# Patient Record
Sex: Male | Born: 1970 | Race: White | Hispanic: No | Marital: Married | Smoking: Never smoker
Health system: Southern US, Community
[De-identification: ages and names within clinical notes are randomized; demographics above are authoritative.]

## PROBLEM LIST (undated history)

## (undated) DIAGNOSIS — J45909 Unspecified asthma, uncomplicated: Secondary | ICD-10-CM

## (undated) DIAGNOSIS — B349 Viral infection, unspecified: Secondary | ICD-10-CM

## (undated) DIAGNOSIS — R002 Palpitations: Secondary | ICD-10-CM

## (undated) DIAGNOSIS — R000 Tachycardia, unspecified: Secondary | ICD-10-CM

## (undated) HISTORY — DX: Tachycardia, unspecified: R00.0

## (undated) HISTORY — DX: Unspecified asthma, uncomplicated: J45.909

## (undated) HISTORY — DX: Palpitations: R00.2

## (undated) HISTORY — DX: Viral infection, unspecified: B34.9

---

## 2018-12-01 ENCOUNTER — Other Ambulatory Visit: Payer: Self-pay

## 2018-12-01 ENCOUNTER — Other Ambulatory Visit: Payer: Self-pay | Admitting: Family Medicine

## 2018-12-01 ENCOUNTER — Ambulatory Visit
Admission: RE | Admit: 2018-12-01 | Discharge: 2018-12-01 | Disposition: A | Discharge status: Home / Self Care | Payer: Self-pay | Source: Ambulatory Visit | Attending: Internal Medicine | Admitting: Internal Medicine

## 2018-12-01 DIAGNOSIS — I10 Essential (primary) hypertension: Secondary | ICD-10-CM | POA: Diagnosis not present

## 2018-12-01 DIAGNOSIS — E039 Hypothyroidism, unspecified: Secondary | ICD-10-CM | POA: Diagnosis not present

## 2018-12-01 DIAGNOSIS — R079 Chest pain, unspecified: Secondary | ICD-10-CM

## 2018-12-01 DIAGNOSIS — E7800 Pure hypercholesterolemia, unspecified: Secondary | ICD-10-CM | POA: Diagnosis not present

## 2018-12-01 DIAGNOSIS — R05 Cough: Secondary | ICD-10-CM | POA: Diagnosis not present

## 2018-12-01 DIAGNOSIS — Z5181 Encounter for therapeutic drug level monitoring: Secondary | ICD-10-CM | POA: Diagnosis not present

## 2018-12-01 DIAGNOSIS — R0789 Other chest pain: Secondary | ICD-10-CM | POA: Diagnosis not present

## 2019-03-28 DIAGNOSIS — J4541 Moderate persistent asthma with (acute) exacerbation: Secondary | ICD-10-CM | POA: Diagnosis not present

## 2019-03-29 DIAGNOSIS — R062 Wheezing: Secondary | ICD-10-CM | POA: Diagnosis not present

## 2020-05-29 ENCOUNTER — Telehealth: Payer: Self-pay | Admitting: Nurse Practitioner

## 2020-05-29 NOTE — Telephone Encounter (Signed)
Pt called to schedule longhauler appt @ [facility]. Pt states mild symptoms began 03/2020. Treated for bronchitis with antibiotic and prednisone. Pt states hx asthma since childhood.  Pt concerned of potential for scarred lungs from [facility] may cause severity if not found & he "get covid".   Pt self refer. Pt states no other specialists only PCP. appt scheduled [facility] next week.

## 2020-06-07 ENCOUNTER — Ambulatory Visit: Payer: BLUE CROSS/BLUE SHIELD

## 2020-06-08 ENCOUNTER — Ambulatory Visit
Admission: RE | Admit: 2020-06-08 | Discharge: 2020-06-08 | Disposition: A | Discharge status: Home / Self Care | Payer: 59 | Source: Ambulatory Visit | Attending: Nurse Practitioner | Admitting: Nurse Practitioner

## 2020-06-08 ENCOUNTER — Ambulatory Visit (INDEPENDENT_AMBULATORY_CARE_PROVIDER_SITE_OTHER): Payer: 59 | Admitting: Nurse Practitioner

## 2020-06-08 VITALS — BP 117/102 | HR 59 | Temp 97.3°F | Resp 18

## 2020-06-08 DIAGNOSIS — Z8619 Personal history of other infectious and parasitic diseases: Secondary | ICD-10-CM | POA: Diagnosis not present

## 2020-06-08 DIAGNOSIS — R002 Palpitations: Secondary | ICD-10-CM

## 2020-06-08 NOTE — Progress Notes (Signed)
@Patient  ID: , male    DOB: 06-19-1970, 50 y.o.   MRN: [id number]  Chief Complaint  Patient presents with  . viral illness    Never tested positive for Covid, Patient was sick with viral illness in December 2021.     Referring provider: January 2022, *    HPI  Patient presents today for post COVID care clinic visit.  Patient states that he was sick in December 2021.  Patient never tested positive but did have family members were positive for Covid.  Patient states that he is doing much better at this point.  He does have a history of asthma and is concerned about his lungs.  He is requesting a chest x-ray to make sure everything is okay and there is no lung damage.  Patient states that since being diagnosed with Covid he has been having periods of tachycardia and palpitations.  He has been trying to stay active, well-hydrated, and trying to eat well. Denies f/c/s, n/v/d, hemoptysis, PND, chest pain or edema.     Not on File   There is no immunization history on file for this patient.  No past medical history on file.  Tobacco History: Social History   Tobacco Use  Smoking Status Not on file  Smokeless Tobacco Not on file   Counseling given: Yes   Outpatient Encounter Medications as of 06/08/2020  Medication Sig  . budesonide-formoterol (SYMBICORT) 160-4.5 MCG/ACT inhaler Inhale into the lungs.  08/06/2020 escitalopram (LEXAPRO) 10 MG tablet Take 10 mg by mouth daily.  Marland Kitchen levothyroxine (SYNTHROID) 100 MCG tablet Take 100 mcg by mouth every morning.  Marland Kitchen lisinopril (ZESTRIL) 20 MG tablet Take 20 mg by mouth daily.  . simvastatin (ZOCOR) 40 MG tablet SMARTSIG:1 Tablet(s) By Mouth Every Evening   No facility-administered encounter medications on file as of 06/08/2020.     Review of Systems  Review of Systems  Constitutional: Negative.  Negative for fatigue and fever.  HENT: Negative.   Respiratory: Negative for cough and shortness of breath.    Cardiovascular: Positive for palpitations. Negative for chest pain and leg swelling.  Gastrointestinal: Negative.   Allergic/Immunologic: Negative.   Neurological: Negative.   Psychiatric/Behavioral: Negative.        Physical Exam  BP (!) 117/102   Pulse (!) 59   Temp (!) 97.3 F (36.3 C)   Resp 18   SpO2 100%   Wt Readings from Last 5 Encounters:  No data found for Wt     Physical Exam Vitals and nursing note reviewed.  Constitutional:      General: He is not in acute distress.    Appearance: He is well-developed and well-nourished.  Cardiovascular:     Rate and Rhythm: Normal rate and regular rhythm.  Pulmonary:     Effort: Pulmonary effort is normal.     Breath sounds: Normal breath sounds.  Musculoskeletal:     Right lower leg: No edema.     Left lower leg: No edema.  Skin:    General: Skin is warm and dry.  Neurological:     Mental Status: He is alert and oriented to person, place, and time.  Psychiatric:        Mood and Affect: Mood and affect and mood normal.        Behavior: Behavior normal.       Imaging: DG Chest 2 View  Result Date: 06/08/2020 CLINICAL DATA:  Follow-up prior COVID infection EXAM: CHEST - 2 VIEW COMPARISON:  12/01/2018 FINDINGS: The heart size and mediastinal contours are within normal limits. Both lungs are clear. The visualized skeletal structures are unremarkable. IMPRESSION: No active cardiopulmonary disease. Electronically Signed   By: Alcide Clever M.D.   On: 06/08/2020 14:38     Assessment & Plan:   History of viral illness Stay well hydrated  Stay active  Deep breathing exercises  May take tylenol or fever or pain  Will order chest x ray:  [facility] [address] [place], [place] [address] [phone] MON - FRI 8:00 AM - 4:00 PM - WALK IN   Tachycardia Palpitations:  Will place referral to cardiology   Follow up:  Follow up if needed      Ivonne Andrew, NP 06/11/2020

## 2020-06-08 NOTE — Patient Instructions (Addendum)
History of viral illness:   Stay well hydrated  Stay active  Deep breathing exercises  May take tylenol or fever or pain  Will order chest x ray:  [facility] [address] [place], [place] [address] [phone] MON - FRI 8:00 AM - 4:00 PM - WALK IN   Tachycardia Palpitations:  Will place referral to cardiology   Follow up:  Follow up if needed

## 2020-06-11 DIAGNOSIS — Z8619 Personal history of other infectious and parasitic diseases: Secondary | ICD-10-CM | POA: Insufficient documentation

## 2020-06-11 DIAGNOSIS — R002 Palpitations: Secondary | ICD-10-CM | POA: Insufficient documentation

## 2020-06-11 NOTE — Assessment & Plan Note (Signed)
Stay well hydrated  Stay active  Deep breathing exercises  May take tylenol or fever or pain  Will order chest x ray:  [facility] [address] [place], [place] [address] [phone] MON - FRI 8:00 AM - 4:00 PM - WALK IN   Tachycardia Palpitations:  Will place referral to cardiology   Follow up:  Follow up if needed

## 2020-06-18 NOTE — Progress Notes (Signed)
Cardiology Office Note:    Date:  06/22/2020   ID:  Brent Morrow, DOB 06/12/1970, MRN [id number]  PCP:  Brent Hale, MD   [facility]  Cardiologist:  No primary care provider on file.  Advanced Practice Provider:  No care team member to display Electrophysiologist:  None    Referring MD: Brent Andrew, NP    History of Present Illness:    Brent Morrow is a 50 y.o. male with a hx of COVID infection and asthmas who was referred by Brent Mallory, NP for further evaluation of palpitations.  Patient states that since being diagnosed with COVID (never tested positive, but multiple family members were known positive in 03/2020), he has been having periods of palpitations and tachycardia. No chest pain, lightheadedness, dizziness or syncope. Has tried to remain active and hydrated.   The patient states that he has noticed his heart rate has increased since COVID. Has not measured the pulse but feels like it is pounding harder. This was worse initially after COVID and has slowly improved. Occasional sensation of heart skipping beats or fluttering but this is unchanged over several years. Patient is active and able to fast walking on the treadmill and then heavy weights without issues. No lightheadedness, dizziness, fainting, LE edema, orthopnea. Resting HR in office is 51bpm.  Had stress test in [place] at age 46 which was normal. TTE with normal EF, moderate PI, mild MR.   Family history: No known history of heart disease.  Past Medical History:  Diagnosis Date   Asthma    Palpitations    Tachycardia    Viral illness     No past surgical history on file.  Current Medications: Current Meds  Medication Sig   albuterol (PROVENTIL) (2.5 MG/3ML) 0.083% nebulizer solution as needed.   budesonide-formoterol (SYMBICORT) 160-4.5 MCG/ACT inhaler Inhale into the lungs.   calcium carbonate (OSCAL) 1500 (600 Ca) MG TABS tablet once a  week.   Cholecalciferol (VITAMIN D3) 50 MCG (2000 UT) capsule 2 (two) times a week.   escitalopram (LEXAPRO) 10 MG tablet Take 10 mg by mouth daily.   Fluticasone-Salmeterol (ADVAIR) 100-50 MCG/DOSE AEPB daily.   levothyroxine (SYNTHROID) 100 MCG tablet Take 100 mcg by mouth every morning.   lisinopril (ZESTRIL) 20 MG tablet Take 20 mg by mouth daily.   Multiple Vitamins-Minerals (MULTI FOR HIM) TABS once a week.   simvastatin (ZOCOR) 40 MG tablet SMARTSIG:1 Tablet(s) By Mouth Every Evening     Allergies:   Patient has no known allergies.   Social History   Socioeconomic History   Marital status: Married    Spouse name: Not on file   Number of children: Not on file   Years of education: Not on file   Highest education level: Not on file  Occupational History   Not on file  Tobacco Use   Smoking status: Never Smoker   Smokeless tobacco: Never Used  Substance and Sexual Activity   Alcohol use: Yes    Comment: occassional   Drug use: Never   Sexual activity: Yes  Other Topics Concern   Not on file  Social History Narrative   Not on file   Social Determinants of Health   Financial Resource Strain: Not on file  Food Insecurity: Not on file  Transportation Needs: Not on file  Physical Activity: Not on file  Stress: Not on file  Social Connections: Not on file     Family History: The patient's family  history is not on file.  ROS:   Please see the history of present illness.    Review of Systems  Constitutional: Negative for chills, fever and malaise/fatigue.  Eyes: Negative for blurred vision and double vision.  Respiratory: Negative for shortness of breath, wheezing and stridor.   Cardiovascular: Positive for palpitations. Negative for chest pain, orthopnea, claudication, leg swelling and PND.  Gastrointestinal: Negative for melena, nausea and vomiting.  Genitourinary: Negative for flank pain and frequency.  Musculoskeletal: Negative for falls.   Neurological: Negative for dizziness and loss of consciousness.  Endo/Heme/Allergies: Negative for polydipsia.  Psychiatric/Behavioral: Negative for substance abuse.    EKGs/Labs/Other Studies Reviewed:    The following studies were reviewed today: Stress test 2015: -No evidence of ischemia -TTE with normal EF, moderate PR, mild MR  EKG:  EKG is  ordered today.  The ekg ordered today demonstrates sinus bradycardia  Recent Labs: No results found for requested labs within last 8760 hours.  Recent Lipid Panel No results found for: CHOL, TRIG, HDL, CHOLHDL, VLDL, LDLCALC, LDLDIRECT   Risk Assessment/Calculations:       Physical Exam:    VS:  BP 112/68    Pulse (!) 51    Ht 5\' 10"  (1.778 m)    Wt 192 lb (87.1 kg)    SpO2 97%    BMI 27.55 kg/m     Wt Readings from Last 3 Encounters:  06/22/20 192 lb (87.1 kg)     GEN:  Well nourished, well developed in no acute distress HEENT: Normal NECK: No JVD; No carotid bruits CARDIAC: Bradycardic, regular, no murmurs, rubs, gallops RESPIRATORY:  Clear to auscultation without rales, wheezing or rhonchi  ABDOMEN: Soft, non-tender, non-distended MUSCULOSKELETAL:  No edema; No deformity  SKIN: Warm and dry NEUROLOGIC:  Alert and oriented x 3 PSYCHIATRIC:  Normal affect   ASSESSMENT:    1. Palpitations   2. History of viral illness   3. Primary hypertension   4. Mixed hyperlipidemia    PLAN:    In order of problems listed above:  #Elevated HR: Improved. Noticed that his resting HR increased after COVID infection. No palpitations, but felt like his heart was pounding. Symptoms have overall improved. He is currently active and exercising 1.5 hours every day without issues. Resting HR in the office 51bpm. ECG without ischemia or block. No further work-up needed at this time. -No further work-up needed at this time as symptoms improving and ECG without concerning changes  #HTN: Well controlled.  -Continue lisinopril 20mg   daily  #HLD: Well controlled with LDL 86. -Continue simvastatin 40mg  daily -Risk stratification with coronary calcium score -Continue healthy diet and exercise as detailed below  #Moderate PI/Mild MR: Noted on TTE performed in [place] in 2015. -Serial monitoring with echo  Exercise recommendations: Goal of exercising for at least 30 minutes a day, at least 5 times per week.  Please exercise to a moderate exertion.  This means that while exercising it is difficult to speak in full sentences, however you are not so short of breath that you feel you must stop, and not so comfortable that you can carry on a full conversation.  Exertion level should be approximately a 5/10, if 10 is the most exertion you can perform.  Diet recommendations: Recommend a heart healthy diet such as the Mediterranean diet.  This diet consists of plant based foods, healthy fats, lean meats, olive oil.  It suggests limiting the intake of simple carbohydrates such as white breads, pastries, and  pastas.  It also limits the amount of red meat, wine, and dairy products such as cheese that one should consume on a daily basis.    Medication Adjustments/Labs and Tests Ordered: Current medicines are reviewed at length with the patient today.  Concerns regarding medicines are outlined above.  Orders Placed This Encounter  Procedures   CT CARDIAC SCORING (SELF PAY ONLY)   EKG 12-Lead   No orders of the defined types were placed in this encounter.   Patient Instructions  Medication Instructions:  Your physician recommends that you continue on your current medications as directed. Please refer to the Current Medication list given to you today.  *If you need a refill on your cardiac medications before your next appointment, please call your pharmacy*   Lab Work: None ordered  If you have labs (blood work) drawn today and your tests are completely normal, you will receive your results only by:  MyChart Message (if  you have MyChart) OR  A paper copy in the mail If you have any lab test that is abnormal or we need to change your treatment, we will call you to review the results.   Testing/Procedures: Your physician recommends that you have a Coronary CT Calcium Score.  This is an out of pocket expense of $99.     Follow-Up: At [facility], you and your health needs are our priority.  As part of our continuing mission to provide you with exceptional heart care, we have created designated Provider Care Teams.  These Care Teams include your primary Cardiologist (physician) and Advanced Practice Providers (APPs -  Physician Assistants and Nurse Practitioners) who all work together to provide you with the care you need, when you need it.  We recommend signing up for the patient portal called "MyChart".  Sign up information is provided on this After Visit Summary.  MyChart is used to connect with patients for Virtual Visits (Telemedicine).  Patients are able to view lab/test results, encounter notes, upcoming appointments, etc.  Non-urgent messages can be sent to your provider as well.   To learn more about what you can do with MyChart, go to [email or web address].    Your next appointment:   As needed   The format for your next appointment:   In Person  Provider:   Laurance Flatten, MD   Other Instructions      Signed, Meriam Sprague, MD  06/22/2020 9:05 AM    [facility]

## 2020-06-22 ENCOUNTER — Other Ambulatory Visit: Payer: Self-pay

## 2020-06-22 ENCOUNTER — Ambulatory Visit: Payer: 59 | Admitting: Cardiology

## 2020-06-22 ENCOUNTER — Encounter: Payer: Self-pay | Admitting: Cardiology

## 2020-06-22 VITALS — BP 112/68 | HR 51 | Ht 70.0 in | Wt 192.0 lb

## 2020-06-22 DIAGNOSIS — R002 Palpitations: Secondary | ICD-10-CM

## 2020-06-22 DIAGNOSIS — I10 Essential (primary) hypertension: Secondary | ICD-10-CM

## 2020-06-22 DIAGNOSIS — E782 Mixed hyperlipidemia: Secondary | ICD-10-CM

## 2020-06-22 DIAGNOSIS — Z8619 Personal history of other infectious and parasitic diseases: Secondary | ICD-10-CM | POA: Diagnosis not present

## 2020-06-22 NOTE — Patient Instructions (Addendum)
Medication Instructions:  Your physician recommends that you continue on your current medications as directed. Please refer to the Current Medication list given to you today.  *If you need a refill on your cardiac medications before your next appointment, please call your pharmacy*   Lab Work: None ordered  If you have labs (blood work) drawn today and your tests are completely normal, you will receive your results only by: Marland Kitchen MyChart Message (if you have MyChart) OR . A paper copy in the mail If you have any lab test that is abnormal or we need to change your treatment, we will call you to review the results.   Testing/Procedures: Your physician recommends that you have a Coronary CT Calcium Score.  This is an out of pocket expense of $99.     Follow-Up: At [facility], you and your health needs are our priority.  As part of our continuing mission to provide you with exceptional heart care, we have created designated Provider Care Teams.  These Care Teams include your primary Cardiologist (physician) and Advanced Practice Providers (APPs -  Physician Assistants and Nurse Practitioners) who all work together to provide you with the care you need, when you need it.  We recommend signing up for the patient portal called "MyChart".  Sign up information is provided on this After Visit Summary.  MyChart is used to connect with patients for Virtual Visits (Telemedicine).  Patients are able to view lab/test results, encounter notes, upcoming appointments, etc.  Non-urgent messages can be sent to your provider as well.   To learn more about what you can do with MyChart, go to [email or web address].    Your next appointment:   As needed   The format for your next appointment:   In Person  Provider:   Laurance Flatten, MD   Other Instructions

## 2020-07-06 ENCOUNTER — Other Ambulatory Visit: Payer: Self-pay

## 2020-07-06 ENCOUNTER — Ambulatory Visit (INDEPENDENT_AMBULATORY_CARE_PROVIDER_SITE_OTHER)
Admission: RE | Admit: 2020-07-06 | Discharge: 2020-07-06 | Disposition: A | Discharge status: Home / Self Care | Payer: Self-pay | Source: Ambulatory Visit | Attending: Cardiology | Admitting: Cardiology

## 2020-07-06 DIAGNOSIS — Z8619 Personal history of other infectious and parasitic diseases: Secondary | ICD-10-CM

## 2021-12-27 DIAGNOSIS — R69 Illness, unspecified: Secondary | ICD-10-CM | POA: Diagnosis not present

## 2021-12-27 DIAGNOSIS — R5383 Other fatigue: Secondary | ICD-10-CM | POA: Diagnosis not present

## 2021-12-27 DIAGNOSIS — E039 Hypothyroidism, unspecified: Secondary | ICD-10-CM | POA: Diagnosis not present

## 2021-12-27 DIAGNOSIS — I10 Essential (primary) hypertension: Secondary | ICD-10-CM | POA: Diagnosis not present

## 2021-12-27 DIAGNOSIS — Z0000 Encounter for general adult medical examination without abnormal findings: Secondary | ICD-10-CM | POA: Diagnosis not present

## 2021-12-27 DIAGNOSIS — E7800 Pure hypercholesterolemia, unspecified: Secondary | ICD-10-CM | POA: Diagnosis not present

## 2021-12-27 DIAGNOSIS — Z125 Encounter for screening for malignant neoplasm of prostate: Secondary | ICD-10-CM | POA: Diagnosis not present

## 2021-12-27 DIAGNOSIS — J4541 Moderate persistent asthma with (acute) exacerbation: Secondary | ICD-10-CM | POA: Diagnosis not present

## 2021-12-27 DIAGNOSIS — Z23 Encounter for immunization: Secondary | ICD-10-CM | POA: Diagnosis not present

## 2021-12-27 DIAGNOSIS — N529 Male erectile dysfunction, unspecified: Secondary | ICD-10-CM | POA: Diagnosis not present

## 2022-03-09 DIAGNOSIS — J45909 Unspecified asthma, uncomplicated: Secondary | ICD-10-CM | POA: Diagnosis not present

## 2022-03-09 DIAGNOSIS — R69 Illness, unspecified: Secondary | ICD-10-CM | POA: Diagnosis not present

## 2022-03-09 DIAGNOSIS — Z7951 Long term (current) use of inhaled steroids: Secondary | ICD-10-CM | POA: Diagnosis not present

## 2022-03-09 DIAGNOSIS — E039 Hypothyroidism, unspecified: Secondary | ICD-10-CM | POA: Diagnosis not present

## 2022-03-09 DIAGNOSIS — I10 Essential (primary) hypertension: Secondary | ICD-10-CM | POA: Diagnosis not present

## 2022-03-09 DIAGNOSIS — E785 Hyperlipidemia, unspecified: Secondary | ICD-10-CM | POA: Diagnosis not present

## 2022-03-09 DIAGNOSIS — Z8249 Family history of ischemic heart disease and other diseases of the circulatory system: Secondary | ICD-10-CM | POA: Diagnosis not present

## 2022-07-16 DIAGNOSIS — J0101 Acute recurrent maxillary sinusitis: Secondary | ICD-10-CM | POA: Diagnosis not present

## 2022-07-16 DIAGNOSIS — J4521 Mild intermittent asthma with (acute) exacerbation: Secondary | ICD-10-CM | POA: Diagnosis not present

## 2022-08-07 DIAGNOSIS — I10 Essential (primary) hypertension: Secondary | ICD-10-CM | POA: Diagnosis not present

## 2022-08-07 DIAGNOSIS — Z87891 Personal history of nicotine dependence: Secondary | ICD-10-CM | POA: Diagnosis not present

## 2022-08-07 DIAGNOSIS — F419 Anxiety disorder, unspecified: Secondary | ICD-10-CM | POA: Diagnosis not present

## 2022-08-07 DIAGNOSIS — Z7951 Long term (current) use of inhaled steroids: Secondary | ICD-10-CM | POA: Diagnosis not present

## 2022-08-07 DIAGNOSIS — E039 Hypothyroidism, unspecified: Secondary | ICD-10-CM | POA: Diagnosis not present

## 2022-08-07 DIAGNOSIS — J45909 Unspecified asthma, uncomplicated: Secondary | ICD-10-CM | POA: Diagnosis not present

## 2022-08-07 DIAGNOSIS — L309 Dermatitis, unspecified: Secondary | ICD-10-CM | POA: Diagnosis not present

## 2022-08-07 DIAGNOSIS — N529 Male erectile dysfunction, unspecified: Secondary | ICD-10-CM | POA: Diagnosis not present

## 2022-08-07 DIAGNOSIS — Z823 Family history of stroke: Secondary | ICD-10-CM | POA: Diagnosis not present

## 2022-08-07 DIAGNOSIS — E785 Hyperlipidemia, unspecified: Secondary | ICD-10-CM | POA: Diagnosis not present

## 2022-08-07 DIAGNOSIS — F325 Major depressive disorder, single episode, in full remission: Secondary | ICD-10-CM | POA: Diagnosis not present

## 2022-08-07 DIAGNOSIS — Z8249 Family history of ischemic heart disease and other diseases of the circulatory system: Secondary | ICD-10-CM | POA: Diagnosis not present

## 2022-08-15 IMAGING — CR DG CHEST 2V
2 series · 2 of 2 positions shown · non-contrast
Comparison: 12/01/2018

CLINICAL DATA: Follow-up prior COVID infection

EXAM:
CHEST - 2 VIEW

[w chest pa]
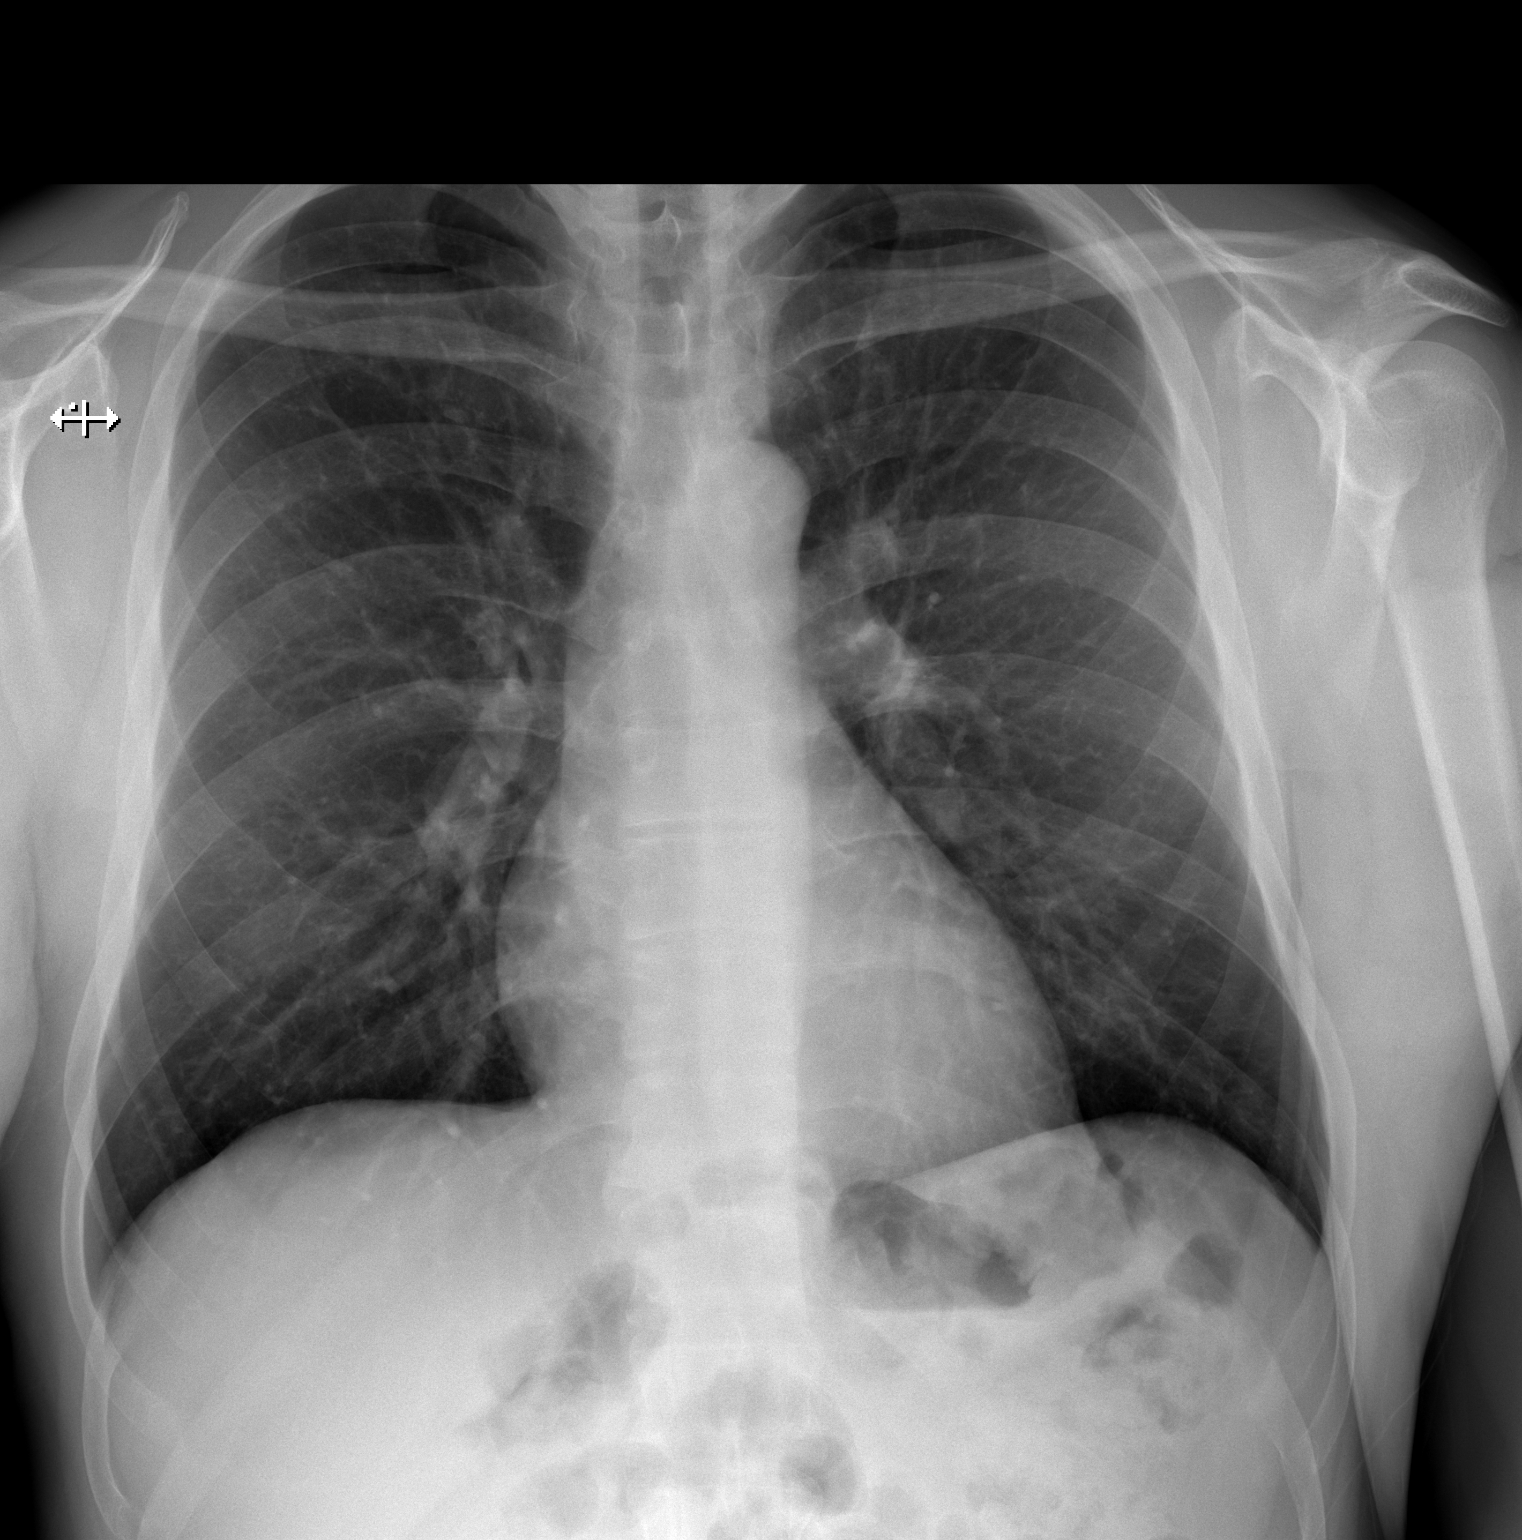

[w chest lat]
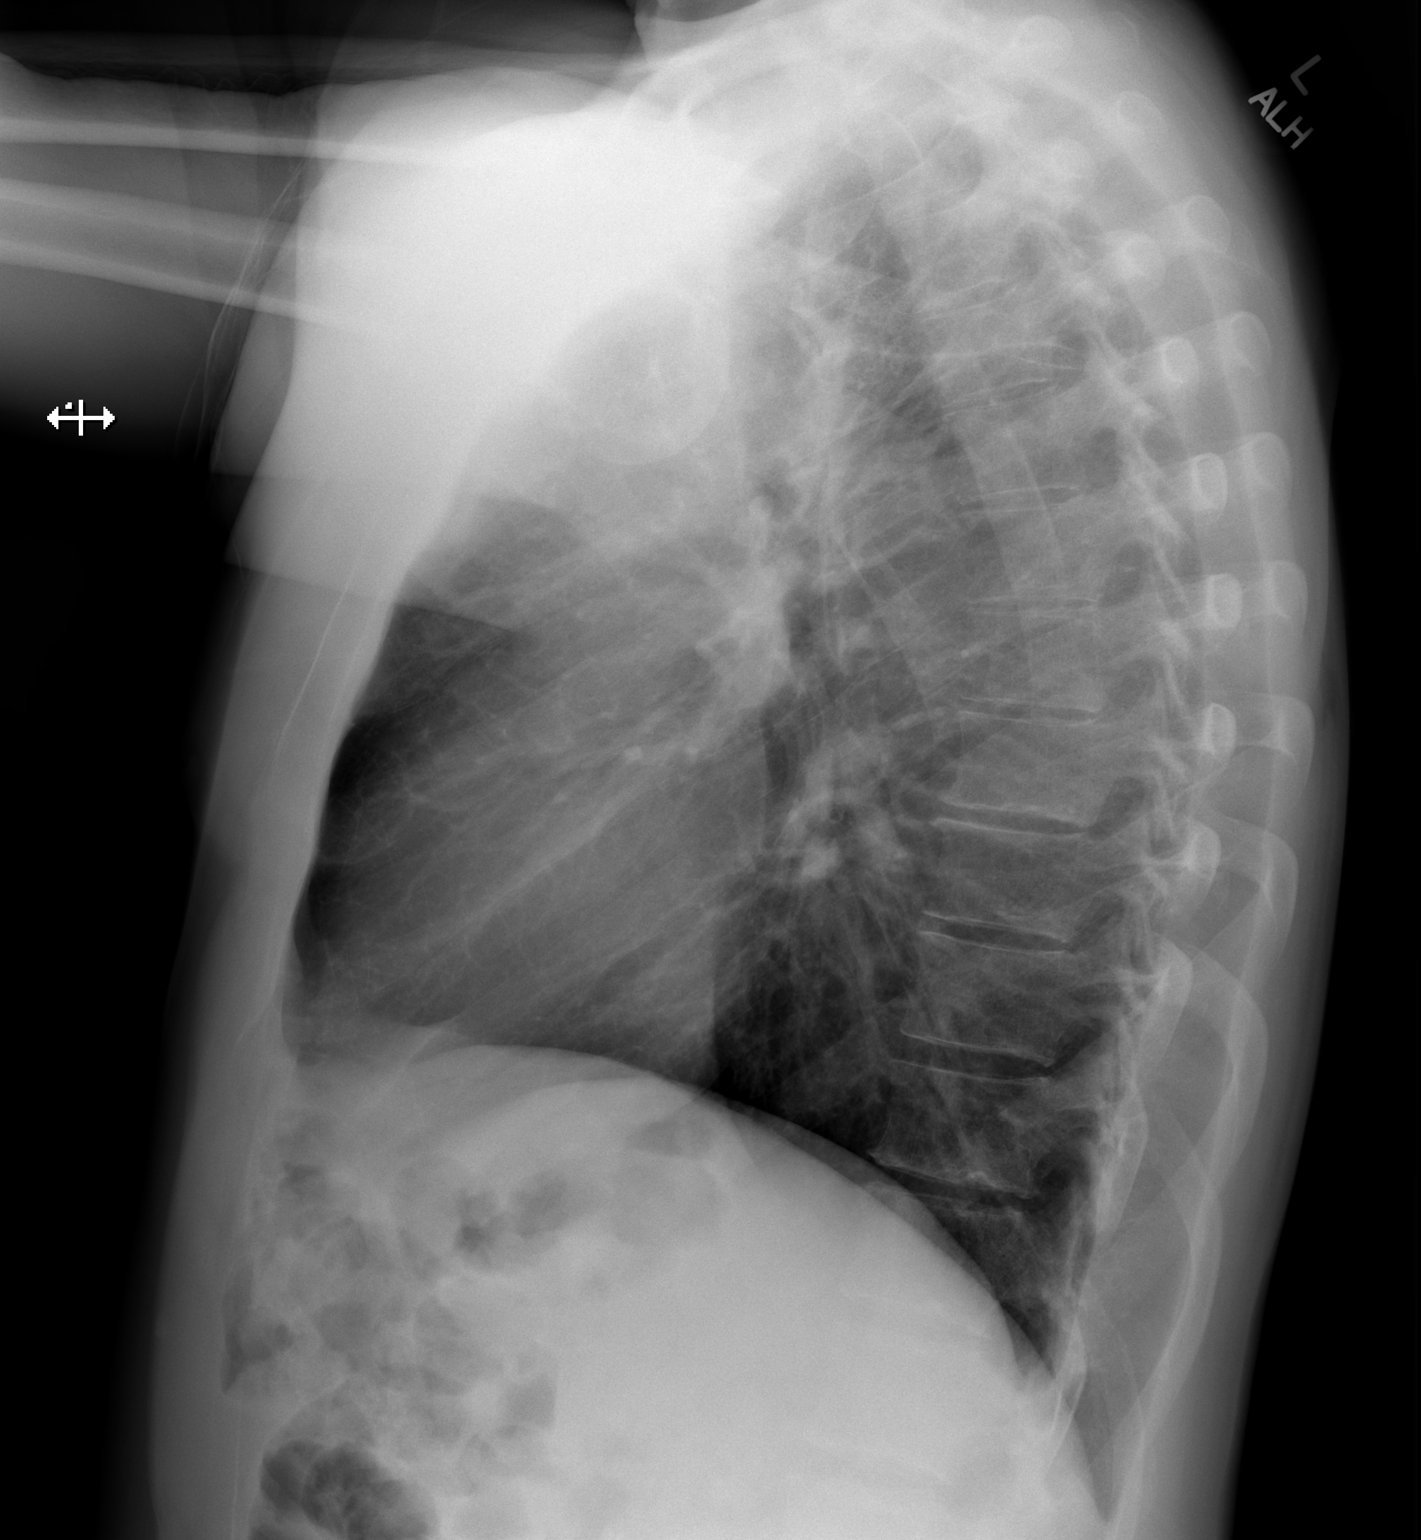

[2 of 2 positions shown; findings below may reference images not displayed]

FINDINGS: The heart size and mediastinal contours are within normal limits.
Both lungs are clear. The visualized skeletal structures are
unremarkable.
IMPRESSION: No active cardiopulmonary disease.

## 2022-09-12 IMAGING — CT CT CARDIAC CORONARY ARTERY CALCIUM SCORE
3 series · 14 of 20 positions shown, 15 images · non-contrast
Comparison: None.
COMPARISON: None.

Addendum:
EXAM:
OVER-READ INTERPRETATION  CT CHEST

The following report is an over-read performed by radiologist Dr.
Shaza Sepulveda [REDACTED] on 07/06/2020. This
over-read does not include interpretation of cardiac or coronary
anatomy or pathology. The coronary calcium score interpretation by
the cardiologist is attached.
CLINICAL DATA: Risk stratification
Coronary Calcium Score
TECHNIQUE: The patient was scanned on a Siemens Force scanner. Axial
non-contrast 3 mm slices were carried out through the heart. The
data set was analyzed on a dedicated work station and scored using
the Agatson method.

[Series 2: casc 3.0 bv41 2 bestdiast 71 % · axial · 0.46mm/px · z∈[-318,-228]mm · 4 of 52 slices shown, 5 images]
[im 11/52  vessel]
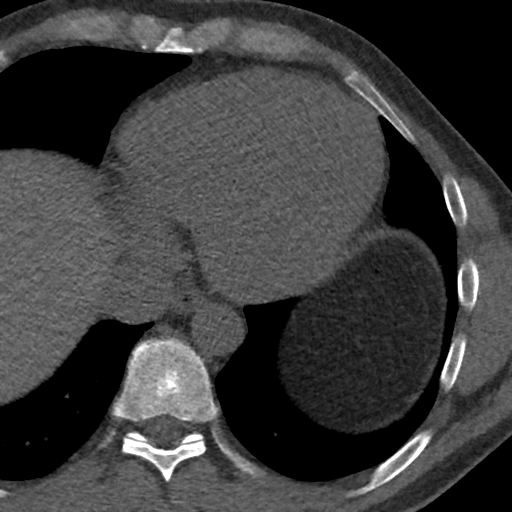
[im 11/52  lung]
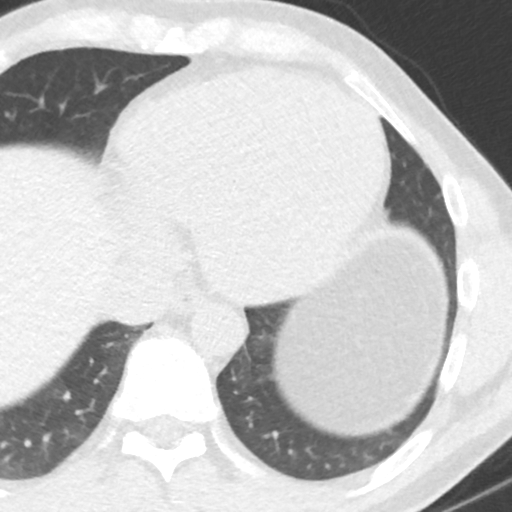
[im 21/52  vessel]
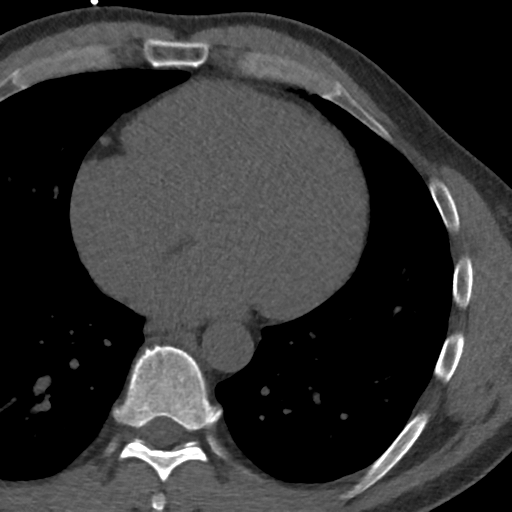
[im 31/52  vessel]
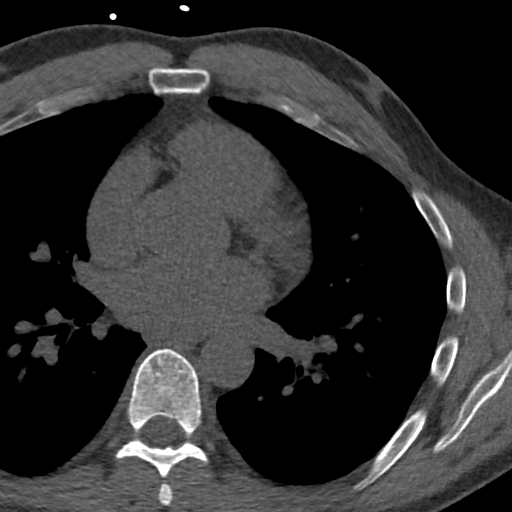
[im 41/52  vessel]
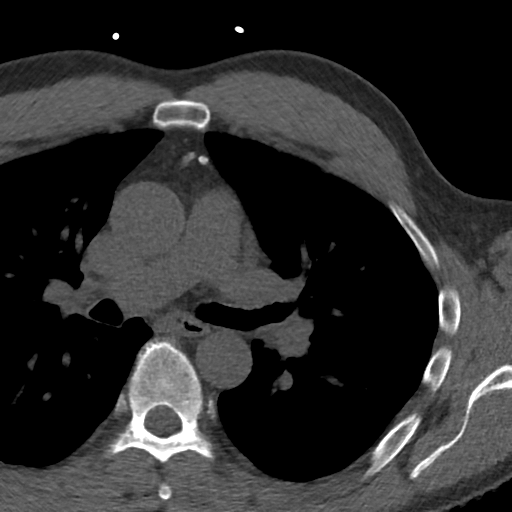

[Series 3: lung 71 % · axial · 0.75mm/px · z∈[-324,-222]mm · 5 of 52 slices shown]
[im 9/52  lung]
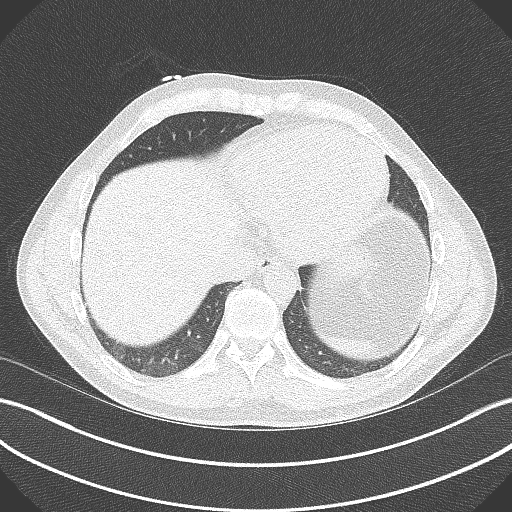
[im 18/52  lung]
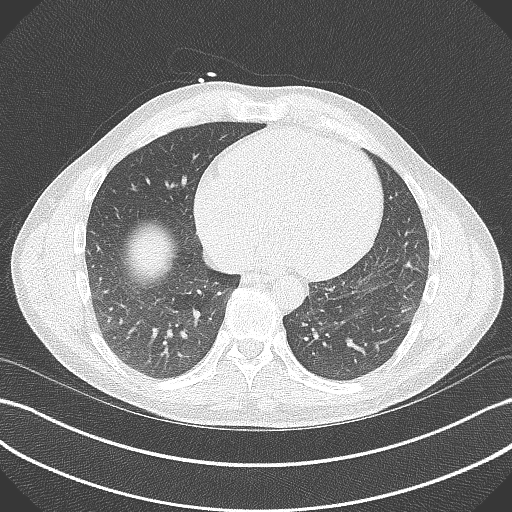
[im 26/52  lung]
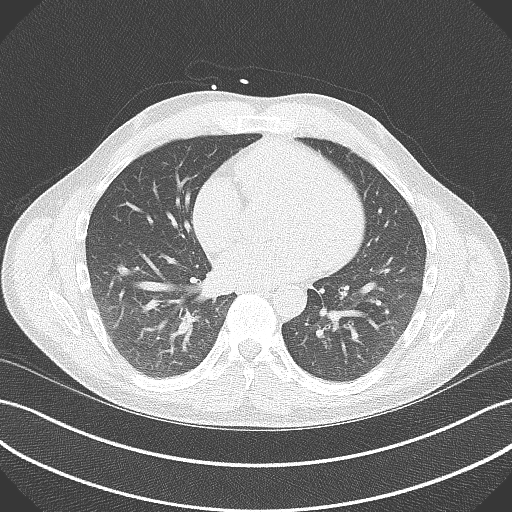
[im 35/52  lung]
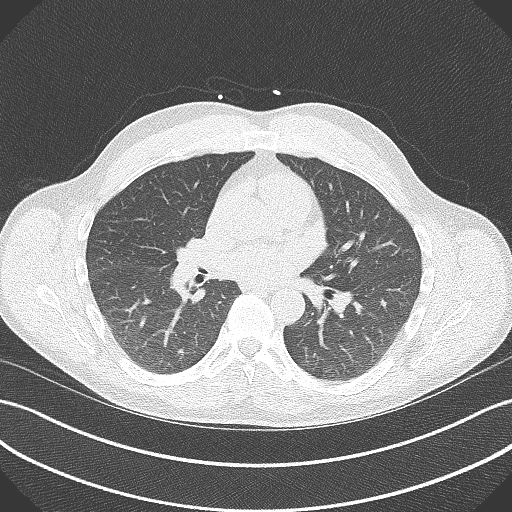
[im 43/52  lung]
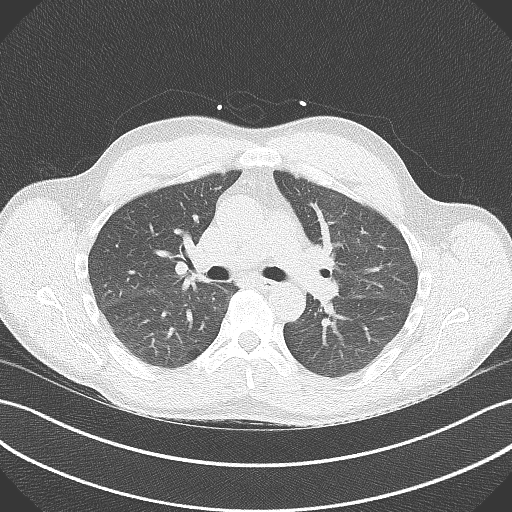

[Series 4: lung st 71 % · axial · 0.75mm/px · z∈[-324,-222]mm · 5 of 52 slices shown]
[im 9/52  lung]
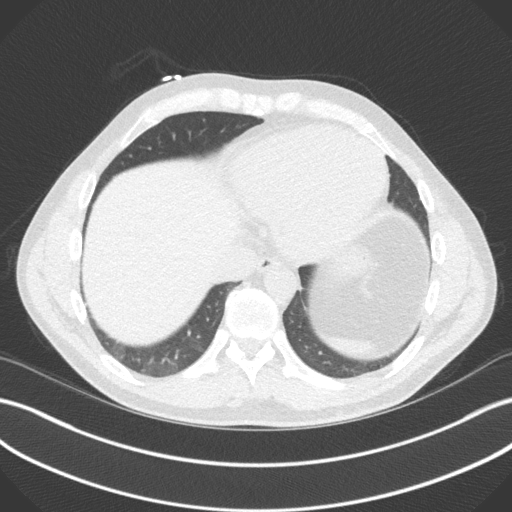
[im 18/52  lung]
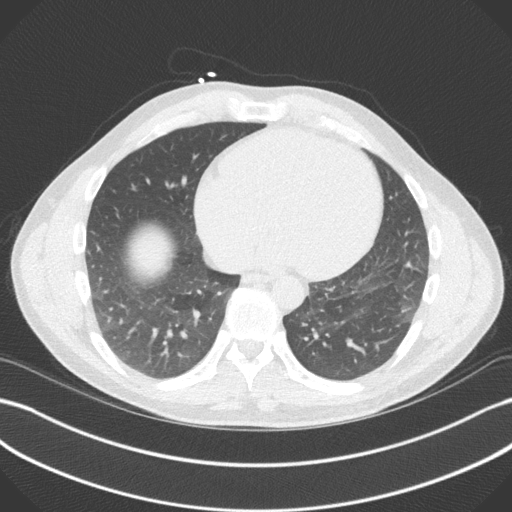
[im 26/52  lung]
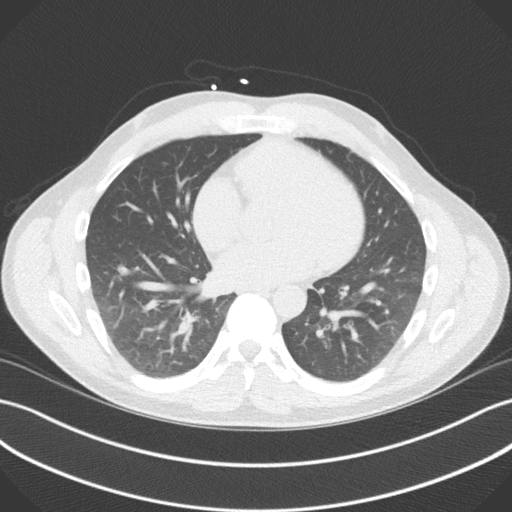
[im 35/52  lung]
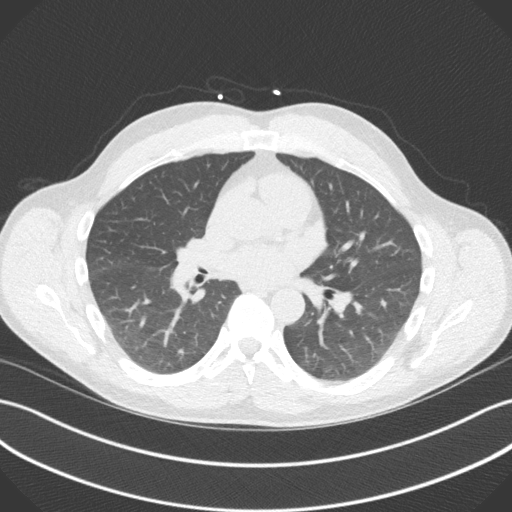
[im 43/52  lung]
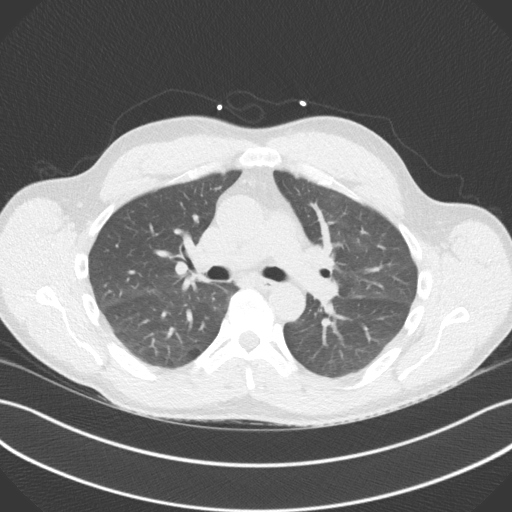

[14 of 20 positions shown; findings below may reference images not displayed]

FINDINGS: Within the visualized portions of the thorax there are no suspicious
appearing pulmonary nodules or masses, there is no acute
consolidative airspace disease, no pleural effusions, no
pneumothorax and no lymphadenopathy. Visualized portions of the
upper abdomen are unremarkable. There are no aggressive appearing
lytic or blastic lesions noted in the visualized portions of the
skeleton.
IMPRESSION: No significant incidental noncardiac findings are noted.
FINDINGS: Non-cardiac: See separate report from [REDACTED].

Ascending Aorta: Normal size

Pericardium: Normal

Coronary arteries: Calcium score 3.  Calcified plaque in left main
IMPRESSION: Coronary calcium score of 3. This was 65th percentile for age and
sex matched control.

*** End of Addendum ***
EXAM:
OVER-READ INTERPRETATION  CT CHEST

The following report is an over-read performed by radiologist Dr.
Shaza Sepulveda [REDACTED] on 07/06/2020. This
over-read does not include interpretation of cardiac or coronary
anatomy or pathology. The coronary calcium score interpretation by
the cardiologist is attached.
FINDINGS: Within the visualized portions of the thorax there are no suspicious
appearing pulmonary nodules or masses, there is no acute
consolidative airspace disease, no pleural effusions, no
pneumothorax and no lymphadenopathy. Visualized portions of the
upper abdomen are unremarkable. There are no aggressive appearing
lytic or blastic lesions noted in the visualized portions of the
skeleton.
IMPRESSION: No significant incidental noncardiac findings are noted.

## 2022-10-21 DIAGNOSIS — R202 Paresthesia of skin: Secondary | ICD-10-CM | POA: Diagnosis not present

## 2022-10-21 DIAGNOSIS — L821 Other seborrheic keratosis: Secondary | ICD-10-CM | POA: Diagnosis not present

## 2022-10-21 DIAGNOSIS — L814 Other melanin hyperpigmentation: Secondary | ICD-10-CM | POA: Diagnosis not present

## 2022-10-21 DIAGNOSIS — D225 Melanocytic nevi of trunk: Secondary | ICD-10-CM | POA: Diagnosis not present

## 2022-10-21 DIAGNOSIS — D492 Neoplasm of unspecified behavior of bone, soft tissue, and skin: Secondary | ICD-10-CM | POA: Diagnosis not present

## 2022-10-21 DIAGNOSIS — L298 Other pruritus: Secondary | ICD-10-CM | POA: Diagnosis not present

## 2022-12-09 DIAGNOSIS — J0100 Acute maxillary sinusitis, unspecified: Secondary | ICD-10-CM | POA: Diagnosis not present

## 2023-02-03 DIAGNOSIS — I10 Essential (primary) hypertension: Secondary | ICD-10-CM | POA: Diagnosis not present

## 2023-02-03 DIAGNOSIS — Z125 Encounter for screening for malignant neoplasm of prostate: Secondary | ICD-10-CM | POA: Diagnosis not present

## 2023-02-03 DIAGNOSIS — E7800 Pure hypercholesterolemia, unspecified: Secondary | ICD-10-CM | POA: Diagnosis not present

## 2023-03-23 DIAGNOSIS — J0100 Acute maxillary sinusitis, unspecified: Secondary | ICD-10-CM | POA: Diagnosis not present

## 2023-04-06 ENCOUNTER — Encounter (INDEPENDENT_AMBULATORY_CARE_PROVIDER_SITE_OTHER): Payer: Self-pay | Admitting: Otolaryngology

## 2023-04-24 ENCOUNTER — Encounter (INDEPENDENT_AMBULATORY_CARE_PROVIDER_SITE_OTHER): Payer: Self-pay

## 2023-04-24 ENCOUNTER — Ambulatory Visit (INDEPENDENT_AMBULATORY_CARE_PROVIDER_SITE_OTHER): Payer: 59 | Admitting: Otolaryngology

## 2023-04-24 VITALS — BP 138/92 | HR 55 | Ht 70.0 in | Wt 202.0 lb

## 2023-04-24 DIAGNOSIS — I340 Nonrheumatic mitral (valve) insufficiency: Secondary | ICD-10-CM | POA: Diagnosis not present

## 2023-04-24 DIAGNOSIS — J343 Hypertrophy of nasal turbinates: Secondary | ICD-10-CM | POA: Insufficient documentation

## 2023-04-24 DIAGNOSIS — J328 Other chronic sinusitis: Secondary | ICD-10-CM

## 2023-04-24 DIAGNOSIS — R079 Chest pain, unspecified: Secondary | ICD-10-CM | POA: Diagnosis not present

## 2023-04-24 DIAGNOSIS — R0981 Nasal congestion: Secondary | ICD-10-CM | POA: Diagnosis not present

## 2023-04-24 DIAGNOSIS — J324 Chronic pansinusitis: Secondary | ICD-10-CM | POA: Insufficient documentation

## 2023-04-24 NOTE — Progress Notes (Signed)
Patient ID: Brent Morrow, male   DOB: 11-Nov-1970, 52 y.o.   MRN: [id number]  CC: Recurrent sinusitis  HPI:  Brent Morrow is a 52 y.o. male who presents today complaining of frequent recurrent sinusitis for the past 10 years.  He typically has 3-4 infections a year.  His last infection was 1 month ago.  He was treated with multiple courses of antibiotics.  He also underwent nasal sinus surgery 5 years ago in [place].  Despite the surgery, he continues to have recurrent infections.  His symptoms include facial pain/pressure and purulent drainage.  He has a history of asthma and severe environmental allergies.  He is currently on Advair and Flonase daily.  He has no recent sinus CT scan.  Past Medical History:  Diagnosis Date   Asthma    Palpitations    Tachycardia    Viral illness     No past surgical history on file.  No family history on file.  Social History:  reports that he has never smoked. He has never used smokeless tobacco. He reports current alcohol use. He reports that he does not use drugs.  Allergies: No Known Allergies  Prior to Admission medications   Medication Sig Start Date End Date Taking? Authorizing Provider  albuterol (PROVENTIL) (2.5 MG/3ML) 0.083% nebulizer solution as needed. 03/07/20   [provider]  budesonide-formoterol (SYMBICORT) 160-4.5 MCG/ACT inhaler Inhale into the lungs.    [provider]  calcium carbonate (OSCAL) 1500 (600 Ca) MG TABS tablet once a week.    [provider]  Cholecalciferol (VITAMIN D3) 50 MCG (2000 UT) capsule 2 (two) times a week.    [provider]  escitalopram (LEXAPRO) 10 MG tablet Take 10 mg by mouth daily. 04/01/20   [provider]  Fluticasone-Salmeterol (ADVAIR) 100-50 MCG/DOSE AEPB daily. 01/25/20   [provider]  levothyroxine (SYNTHROID) 100 MCG tablet Take 100 mcg by mouth every morning. 04/01/20   [provider]  lisinopril (ZESTRIL) 20 MG tablet Take 20  mg by mouth daily. 04/21/20   [provider]  Multiple Vitamins-Minerals (MULTI FOR HIM) TABS once a week.    [provider]  simvastatin (ZOCOR) 40 MG tablet SMARTSIG:1 Tablet(s) By Mouth Every Evening 04/01/20   [provider]    There were no vitals taken for this visit. Exam: General: Communicates without difficulty, well nourished, no acute distress. Head: Normocephalic, no evidence injury, no tenderness, facial buttresses intact without stepoff. Face/sinus: No tenderness to palpation and percussion. Facial movement is normal and symmetric. Eyes: PERRL, EOMI. No scleral icterus, conjunctivae clear. Neuro: CN II exam reveals vision grossly intact.  No nystagmus at any point of gaze. Ears: Auricles well formed without lesions.  Ear canals are intact without mass or lesion.  No erythema or edema is appreciated.  The TMs are intact without fluid. Nose: External evaluation reveals normal support and skin without lesions.  Dorsum is intact.  Anterior rhinoscopy reveals congested mucosa over anterior aspect of inferior turbinates and intact septum.  No purulence noted. Oral:  Oral cavity and oropharynx are intact, symmetric, without erythema or edema.  Mucosa is moist without lesions. Neck: Full range of motion without pain.  There is no significant lymphadenopathy.  No masses palpable.  Thyroid bed within normal limits to palpation.  Parotid glands and submandibular glands equal bilaterally without mass.  Trachea is midline. Neuro:  CN 2-12 grossly intact.   Procedure:  Flexible Nasal Endoscopy: Description: Risks, benefits, and alternatives of flexible endoscopy  were explained to the patient.  Specific mention was made of the risk of throat numbness with difficulty swallowing, possible bleeding from the nose and mouth, and pain from the procedure.  The patient gave oral consent to proceed.  The flexible scope was inserted into the right nasal cavity.  Endoscopy of the interior  nasal cavity, superior, inferior, and middle meatus was performed. The sphenoid-ethmoid recess was examined. Edematous mucosa was noted. Olfactory cleft was clear.  Nasopharynx was clear.  Turbinates were hypertrophied but without mass.  The procedure was repeated on the contralateral side with similar findings.  The patient tolerated the procedure well.   Assessment: 1.  Bilateral chronic rhinosinusitis, with recurrent exacerbations.  No purulent drainage is noted today. 2.  Diffuse nasal mucosal congestion and bilateral inferior turbinate hypertrophy.  Plan: 1.  The physical exam and nasal endoscopy findings are reviewed with the patient. 2.  Continue with Flonase nasal spray 2 sprays each nostril daily. 3.  The patient is reassured and no acute infection is noted today. 4.  Sinus CT scan to evaluate for the extent of his chronic rhinosinusitis. 5.  The patient will return for reevaluation after his sinus CT scan.  Isobella Ascher W Adea Geisel 04/24/2023, 8:29 AM

## 2023-04-27 ENCOUNTER — Other Ambulatory Visit (HOSPITAL_COMMUNITY): Payer: Self-pay | Admitting: Family Medicine

## 2023-04-27 DIAGNOSIS — I340 Nonrheumatic mitral (valve) insufficiency: Secondary | ICD-10-CM

## 2023-04-28 ENCOUNTER — Ambulatory Visit (HOSPITAL_COMMUNITY): Payer: 59 | Attending: Cardiology

## 2023-04-28 DIAGNOSIS — I340 Nonrheumatic mitral (valve) insufficiency: Secondary | ICD-10-CM | POA: Insufficient documentation

## 2023-04-28 LAB — ECHOCARDIOGRAM COMPLETE
Area-P 1/2: 3.03 cm2
S' Lateral: 3.8 cm

## 2023-05-14 ENCOUNTER — Encounter: Payer: Self-pay | Admitting: Cardiology

## 2023-05-14 ENCOUNTER — Ambulatory Visit: Payer: 59 | Attending: Cardiology | Admitting: Cardiology

## 2023-05-14 VITALS — BP 132/88 | HR 90 | Resp 16 | Ht 70.0 in | Wt 205.0 lb

## 2023-05-14 DIAGNOSIS — R002 Palpitations: Secondary | ICD-10-CM

## 2023-05-14 DIAGNOSIS — I10 Essential (primary) hypertension: Secondary | ICD-10-CM | POA: Diagnosis not present

## 2023-05-14 DIAGNOSIS — E782 Mixed hyperlipidemia: Secondary | ICD-10-CM | POA: Diagnosis not present

## 2023-05-14 DIAGNOSIS — R072 Precordial pain: Secondary | ICD-10-CM

## 2023-05-14 NOTE — Patient Instructions (Signed)
Medication Instructions:   Over the counter Tylenol or Motrin for 1 week. If pain doesn't subside, give our office a call.  *If you need a refill on your cardiac medications before your next appointment, please call your pharmacy*  Follow-Up: At [facility], you and your health needs are our priority.  As part of our continuing mission to provide you with exceptional heart care, we have created designated Provider Care Teams.  These Care Teams include your primary Cardiologist (physician) and Advanced Practice Providers (APPs -  Physician Assistants and Nurse Practitioners) who all work together to provide you with the care you need, when you need it.  We recommend signing up for the patient portal called "MyChart".  Sign up information is provided on this After Visit Summary.  MyChart is used to connect with patients for Virtual Visits (Telemedicine).  Patients are able to view lab/test results, encounter notes, upcoming appointments, etc.  Non-urgent messages can be sent to your provider as well.   To learn more about what you can do with MyChart, go to [email or web address].    Your next appointment:   As needed  Provider:   Tessa Lerner, DO       1st Floor: - Lobby - Registration  - Pharmacy  - Lab - Cafe  2nd Floor: - PV Lab - Diagnostic Testing (echo, CT, nuclear med)  3rd Floor: - Vacant  4th Floor: - TCTS (cardiothoracic surgery) - AFib Clinic - Structural Heart Clinic - Vascular Surgery  - Vascular Ultrasound  5th Floor: - HeartCare Cardiology (general and EP) - Clinical Pharmacy for coumadin, hypertension, lipid, weight-loss medications, and med management appointments    Valet parking services will be available as well.

## 2023-05-14 NOTE — Progress Notes (Signed)
Cardiology Office Note:  .   Date:  05/14/2023  ID:  Brent Morrow, DOB 1970/05/17, MRN [id number] PCP:  Shon Hale, MD  Former Cardiology Providers: Dr. Lowella [facility] Providers Cardiologist:  Tessa Lerner, DO , [facility] (established care 05/14/23) Electrophysiologist:  None  Click to update primary MD,subspecialty MD or APP then REFRESH:1}    No chief complaint on file.   History of Present Illness: .   Brent Morrow is a 53 y.o. Caucasian male whose past medical history and cardiovascular risk factors includes: History of infection, asthma, hypertension, hyperlipidemia.  Patient was referred to practice for evaluation of chest pain.  Chest pain: Located left lateral anterior chest wall. Intensity <1 out of 10 Present all the time. More noticeable with inhalation Ongoing for the last 1 month Pain is not reproducible with palpation. Pain is nonpositional. No meds taken. Had an echocardiogram for the discomfort in December 2024 which noted preserved LVEF, no regional wall motion abnormalities, no significant valvular heart disease. Due to the prolonged duration presents to the office for reevaluation.  No change in overall physical endurance.  Has not had any prolonged reoccurrence of palpitations since he saw Dr. Shari Prows in 2022.  No sick contacts in the recent past, kids were sick in December but not back to baseline. No recent immobilization, no history of DVT or PE.  Has had shingles in the past, over the right posterior thorax  Review of Systems: .   Review of Systems  Cardiovascular:  Positive for chest pain (see HPI). Negative for claudication, irregular heartbeat, leg swelling, near-syncope, orthopnea, palpitations, paroxysmal nocturnal dyspnea and syncope.  Respiratory:  Negative for shortness of breath.   Hematologic/Lymphatic: Negative for bleeding problem.   Studies Reviewed:   EKG: EKG Interpretation Date/Time:  Thursday May 14 2023 09:55:54 EST Ventricular Rate:  48 PR Interval:  144 QRS Duration:  90 QT Interval:  436 QTC Calculation: 389 R Axis:   37  Text Interpretation: Sinus bradycardia No previous ECGs available Confirmed by Tessa Lerner [phone]) on 05/14/2023 10:13:11 AM  Echocardiogram: April 28, 2023: LVEF 60 to 65%, no regional wall motion abnormality, normal diastolic function, global longitudinal strain -20.4% Right ventricular size and function normal. Mild biatrial enlargement Mild MR. Moderate PR Aortic root 38 mm Estimated RAP 3 mmHg  Coronary artery calcium score: March 2022 Coronary calcium score of 3. This was 65th percentile for age and sex matched control. No significant incidental noncardiac findings are noted.  RADIOLOGY: NA  Risk Assessment/Calculations:   NA   Labs:    External Labs: Collected: February 03, 2023 [facility] database. Total cholesterol 166, triglycerides 94, HDL 54, LDL 95. Hemoglobin 16.5. Potassium 4.6. TSH 4.2  Physical Exam:    Today's Vitals   05/14/23 0952  BP: 132/88  Pulse: 90  Resp: 16  SpO2: 96%  Weight: 205 lb (93 kg)  Height: 5\' 10"  (1.778 m)   Body mass index is 29.41 kg/m. Wt Readings from Last 3 Encounters:  05/14/23 205 lb (93 kg)  04/24/23 202 lb (91.6 kg)  06/22/20 192 lb (87.1 kg)    Physical Exam  Constitutional: No distress.  hemodynamically stable  Neck: No JVD present.  Cardiovascular: Normal rate, regular rhythm, S1 normal and S2 normal. Exam reveals no gallop, no S3 and no S4.  No murmur heard. Pulmonary/Chest: Effort normal and breath sounds normal. No stridor. He has no wheezes. He has no rales.  Abdominal: Soft. Bowel sounds are normal. He exhibits no  distension. There is no abdominal tenderness.  Musculoskeletal:        General: No edema.     Cervical back: Neck supple.  Neurological: He is alert and oriented to person, place, and time. He has intact cranial nerves (2-12).  Skin: Skin is warm.   Impression  & Recommendation(s):  Impression:   ICD-10-CM   1. Precordial pain  R07.2     2. Palpitations  R00.2 EKG 12-Lead       Recommendation(s):  Precordial pain Patient's precordial discomfort predominantly noncardiac EKG illustrates sinus rhythm without underlying ischemia or injury pattern Echo from December 2024 noted preserved LVEF, no regional wall motion abnormalities, normal diastolic function. Noted to have minimal CAC back in 2022. No change in overall physical endurance/capacity. The precordial pain is predominately pleuritic and not positional. Recommend a trial of Motrin/Tylenol for the 1 week to see if anti-inflammatory medications can help relieve the discomfort -sounds to be costochondritis. However, given his history of shingles recommended that he remains cognizant and rechecks the area often. His Wells score is 0, low risk for VTE. However,if after 1 week of anti-inflammatory he still continues to have precordial pain I think is very reasonable to proceed forward with checking a D-dimer as well as a GXT.  Patient agreeable with the plan of care. Moreover, in the interim if the symptoms increase in intensity frequency or duration he is advised to go to the ER for further evaluation and management.  Palpitations Symptoms have improved significantly since his last office visit with Dr. Shari Prows in 2022.  Benign essential hypertension: Office blood pressures are well-controlled. Continue lisinopril 20 mg p.o. daily  Hyperlipidemia: Currently on simvastatin 40 mg p.o. daily Does not endorse myalgias. Labs independently reviewed from October 2024, LDL 95 mg/dL  Given his findings of mild MR/moderate PR recommend follow-up echocardiogram in 2-3 years to reevaluate or sooner if change in clinical status.  Patient is agreeable with the plan of care.  Orders Placed:  Orders Placed This Encounter  Procedures   EKG 12-Lead    Final Medication List:   No orders of the  defined types were placed in this encounter.   There are no discontinued medications.   Current Outpatient Medications:    albuterol (PROVENTIL) (2.5 MG/3ML) 0.083% nebulizer solution, as needed., Disp: , Rfl:    budesonide-formoterol (SYMBICORT) 160-4.5 MCG/ACT inhaler, Inhale into the lungs., Disp: , Rfl:    buPROPion (WELLBUTRIN XL) 150 MG 24 hr tablet, Take 150 mg by mouth every morning., Disp: , Rfl:    calcium carbonate (OSCAL) 1500 (600 Ca) MG TABS tablet, once a week., Disp: , Rfl:    Cholecalciferol (VITAMIN D3) 50 MCG (2000 UT) capsule, 2 (two) times a week., Disp: , Rfl:    escitalopram (LEXAPRO) 10 MG tablet, Take 10 mg by mouth daily., Disp: , Rfl:    Fluticasone-Salmeterol (ADVAIR) 100-50 MCG/DOSE AEPB, daily., Disp: , Rfl:    levothyroxine (SYNTHROID) 100 MCG tablet, Take 100 mcg by mouth every morning., Disp: , Rfl:    lisinopril (ZESTRIL) 20 MG tablet, Take 20 mg by mouth daily., Disp: , Rfl:    Multiple Vitamins-Minerals (MULTI FOR HIM) TABS, once a week., Disp: , Rfl:    Potassium 99 MG TABS, Take 99 mg by mouth once a week., Disp: , Rfl:    simvastatin (ZOCOR) 40 MG tablet, SMARTSIG:1 Tablet(s) By Mouth Every Evening, Disp: , Rfl:    Taurine 1000 MG CAPS, Take 1,000 mg by mouth 2 (two) times  a week., Disp: , Rfl:   Consent:   NA  Disposition:   As needed basis  His questions and concerns were addressed to his satisfaction. He voices understanding of the recommendations provided during this encounter.    Signed, Tessa Lerner, DO, [facility]  [facility]  [address] [place], [place] [address] 05/14/2023 10:39 AM

## 2023-05-20 DIAGNOSIS — J40 Bronchitis, not specified as acute or chronic: Secondary | ICD-10-CM | POA: Diagnosis not present

## 2023-05-25 DIAGNOSIS — J189 Pneumonia, unspecified organism: Secondary | ICD-10-CM | POA: Diagnosis not present

## 2023-05-29 ENCOUNTER — Ambulatory Visit (INDEPENDENT_AMBULATORY_CARE_PROVIDER_SITE_OTHER): Payer: 59

## 2023-06-11 ENCOUNTER — Telehealth: Payer: Self-pay

## 2023-06-11 ENCOUNTER — Encounter: Payer: Self-pay | Admitting: Neurology

## 2023-06-11 ENCOUNTER — Ambulatory Visit: Payer: 59 | Admitting: Neurology

## 2023-06-11 VITALS — BP 153/100 | HR 63 | Ht 70.0 in | Wt 207.0 lb

## 2023-06-11 DIAGNOSIS — R419 Unspecified symptoms and signs involving cognitive functions and awareness: Secondary | ICD-10-CM | POA: Diagnosis not present

## 2023-06-11 DIAGNOSIS — R41840 Attention and concentration deficit: Secondary | ICD-10-CM

## 2023-06-11 NOTE — Patient Instructions (Signed)
Continue current medications  Continue to follow up with PCP  Consider an evaluation with the [facility]  Return as needed    There are well-accepted and sensible ways to reduce risk for Alzheimers disease and other degenerative brain disorders .  Exercise Daily Walk A daily 20 minute walk should be part of your routine. Disease related apathy can be a significant roadblock to exercise and the only way to overcome this is to make it a daily routine and perhaps have a reward at the end (something your loved one loves to eat or drink perhaps) or a personal trainer coming to the home can also be very useful. Most importantly, the patient is much more likely to exercise if the caregiver / spouse does it with him/her. In general a structured, repetitive schedule is best.  General Health: Any diseases which effect your body will effect your brain such as a pneumonia, urinary infection, blood clot, heart attack or stroke. Keep contact with your primary care doctor for regular follow ups.  Sleep. A good nights sleep is healthy for the brain. Seven hours is recommended. If you have insomnia or poor sleep habits we can give you some instructions. If you have sleep apnea wear your mask.  Diet: Eating a heart healthy diet is also a good idea; fish and poultry instead of red meat, nuts (mostly non-peanuts), vegetables, fruits, olive oil or canola oil (instead of butter), minimal salt (use other spices to flavor foods), whole grain rice, bread, cereal and pasta and wine in moderation.Research is now showing that the MIND diet, which is a combination of The Mediterranean diet and the DASH diet, is beneficial for cognitive processing and longevity. Information about this diet can be found in The MIND Diet, a book by Alonna Minium, MS, RDN, and online at [employer]Wild[employer].es  Finances, Power of [address] and Advance Directives: You should consider putting legal safeguards in  place with regard to financial and medical decision making. While the spouse always has power of attorney for medical and financial issues in the absence of any form, you should consider what you want in case the spouse / caregiver is no longer around or capable of making decisions.

## 2023-06-11 NOTE — Progress Notes (Signed)
 [facility]  PATIENT: Brent Morrow DOB: 1970/09/14  REQUESTING CLINICIAN: Shon Hale, * HISTORY FROM: Patient  REASON FOR VISIT: Concern about memory loss    HISTORICAL  CHIEF COMPLAINT:  Chief Complaint  Patient presents with   New Patient (Initial Visit)    Rm13, alone, NP/[facility] @ Sallyanne Kuster MD [phone]/memory changes: moca 27, pt stated that he's taken many tests and scored very well but says he walks into rooms and forgets why he walked in there. Says last night he accidentally left car door open in rain. He stated that his father had dementia and is scared that he may have it as well. Had covid 2 times and feels like that may be related to memory issues.     HISTORY OF PRESENT ILLNESS:  This 53 year old gentleman past medical history of hypertension, hypothyroidism, anxiety who is presenting with complaints of memory change for the past 8 months.  Memory change described as poor memory, easy distractibility and walking into room and forgetting the reason why he came into the room.  He also tells me that wife has complained about his memory, telling him multiple times we discussed this, or "I told you so".  He reports in December 2023, he was infected with COVID and had severe brain fog after that.  He feels that he has not completely recovered from that episode.  He reports his father with dementia, likely vascular dementia but otherwise no other family history of dementia. He is independent all actives of daily living.  He also tells me that throughout his life he always noted that his memory was poor, feels like he is easily distracted,  has never been evaluated for attention deficit.   TBI: No past history of TBI Stroke: no past history of stroke Seizures: no past history of seizures Sleep:  no history of sleep apnea.  Mood: Yes, Bupropion and Escitalopram  Family history of Dementia: Father with dementia,    Functional status: independent in all ADLs and IADLs Patient lives with family. Cooking: no issues Cleaning: no issues Shopping: no issues Bathing: no issues Toileting: no issues Driving: Move here 5 years ago, relies on GPS Bills: no issues Medications: no issues Ever left the stove on by accident?: denies Forget how to use items around the house?: denies Getting lost going to familiar places?: denies Forgetting loved ones names?: sometime will forget his children sport teammates  Word finding difficulty? No issues Sleep: no issues    OTHER MEDICAL CONDITIONS: Hypertension, Hypothyroidism, Depression   REVIEW OF SYSTEMS: Full 14 system review of systems performed and negative with exception of: As noted in the HPI   ALLERGIES: No Active Allergies  HOME MEDICATIONS: Outpatient Medications Prior to Visit  Medication Sig Dispense Refill   albuterol (PROVENTIL) (2.5 MG/3ML) 0.083% nebulizer solution as needed.     budesonide-formoterol (SYMBICORT) 160-4.5 MCG/ACT inhaler Inhale into the lungs.     buPROPion (WELLBUTRIN XL) 150 MG 24 hr tablet Take 150 mg by mouth every morning.     calcium carbonate (OSCAL) 1500 (600 Ca) MG TABS tablet once a week.     Cholecalciferol (VITAMIN D3) 50 MCG (2000 UT) capsule 2 (two) times a week.     escitalopram (LEXAPRO) 10 MG tablet Take 10 mg by mouth daily.     Fluticasone-Salmeterol (ADVAIR) 100-50 MCG/DOSE AEPB daily.     levothyroxine (SYNTHROID) 100 MCG tablet Take 100 mcg by mouth every morning.     lisinopril (ZESTRIL) 20 MG tablet  Take 20 mg by mouth daily.     Multiple Vitamins-Minerals (MULTI FOR HIM) TABS once a week.     Potassium 99 MG TABS Take 99 mg by mouth once a week.     simvastatin (ZOCOR) 40 MG tablet SMARTSIG:1 Tablet(s) By Mouth Every Evening     Taurine 1000 MG CAPS Take 1,000 mg by mouth 2 (two) times a week.     No facility-administered medications prior to visit.    PAST MEDICAL HISTORY: Past Medical  History:  Diagnosis Date   Asthma    Palpitations    Tachycardia    Viral illness     PAST SURGICAL HISTORY: History reviewed. No pertinent surgical history.  FAMILY HISTORY: Family History  Problem Relation Age of Onset   Dementia Father     SOCIAL HISTORY: Social History   Socioeconomic History   Marital status: Married    Spouse name: Not on file   Number of children: Not on file   Years of education: Not on file   Highest education level: Not on file  Occupational History   Not on file  Tobacco Use   Smoking status: Never   Smokeless tobacco: Never  Substance and Sexual Activity   Alcohol use: Yes    Comment: occassional   Drug use: Never   Sexual activity: Yes  Other Topics Concern   Not on file  Social History Narrative   Not on file   Social Drivers of Health   Financial Resource Strain: Not on file  Food Insecurity: Not on file  Transportation Needs: Not on file  Physical Activity: Not on file  Stress: Not on file  Social Connections: Not on file  Intimate Partner Violence: Not on file    PHYSICAL EXAM  GENERAL EXAM/CONSTITUTIONAL: Vitals:  Vitals:   06/11/23 1024 06/11/23 1033  BP: (!) 143/106 (!) 153/100  Pulse: (!) 59 63  Weight: 207 lb (93.9 kg)   Height: 5\' 10"  (1.778 m)    Body mass index is 29.7 kg/m. Wt Readings from Last 3 Encounters:  06/11/23 207 lb (93.9 kg)  05/14/23 205 lb (93 kg)  04/24/23 202 lb (91.6 kg)   Patient is in no distress; well developed, nourished and groomed; neck is supple  MUSCULOSKELETAL: Gait, strength, tone, movements noted in Neurologic exam below  NEUROLOGIC: MENTAL STATUS:      No data to display         awake, alert, oriented to person, place and time recent and remote memory intact normal attention and concentration language fluent, comprehension intact, naming intact fund of knowledge appropriate     06/11/2023   10:28 AM  [facility] Cognitive Assessment   Visuospatial/ Executive  (0/5) 5  Naming (0/3) 3  Attention: Read list of digits (0/2) 2  Attention: Read list of letters (0/1) 1  Attention: Serial 7 subtraction starting at 100 (0/3) 3  Language: Repeat phrase (0/2) 1  Language : Fluency (0/1) 1  Abstraction (0/2) 2  Delayed Recall (0/5) 3  Orientation (0/6) 6  Total 27    CRANIAL NERVE:  2nd, 3rd, 4th, 6th- visual fields full to confrontation, extraocular muscles intact, no nystagmus 5th - facial sensation symmetric 7th - facial strength symmetric 8th - hearing intact 9th - palate elevates symmetrically, uvula midline 11th - shoulder shrug symmetric 12th - tongue protrusion midline  MOTOR:  normal bulk and tone, full strength in the BUE, BLE  SENSORY:  normal and symmetric to light touch  COORDINATION:  finger-nose-finger, fine finger movements normal  GAIT/STATION:  normal   DIAGNOSTIC DATA (LABS, IMAGING, TESTING) - I reviewed patient records, labs, notes, testing and imaging myself where available.  No results found for: "WBC", "HGB", "HCT", "MCV", "PLT" No results found for: "NA", "K", "CL", "CO2", "GLUCOSE", "BUN", "CREATININE", "CALCIUM", "PROT", "ALBUMIN", "AST", "ALT", "ALKPHOS", "BILITOT", "GFRNONAA", "GFRAA" No results found for: "CHOL", "HDL", "LDLCALC", "LDLDIRECT", "TRIG", "CHOLHDL" No results found for: "HGBA1C" No results found for: "VITAMINB12" No results found for: "TSH"    ASSESSMENT AND PLAN  53 y.o. year old male with history of hypertension, anxiety/depression, hypothyroidism who is presenting with memory concerns described as being forgetful, walking into a room and forgetting the reason why he is there.  Also reports that wife has mentioned that he is forgetful.  He is independent all actives of daily living, normal MoCA score and normal cognition.  Based on his history I do suspect the patient may have some underlying attention deficit.  I have recommended him to get evaluated for attention deficit disorder.  He  voiced understanding.  Continue to follow with PCP and return as needed.   1. Cognitive complaints with normal exam   2. Attention deficit      Patient Instructions  Continue current medications  Continue to follow up with PCP  Consider an evaluation with the [facility]  Return as needed    There are well-accepted and sensible ways to reduce risk for Alzheimers disease and other degenerative brain disorders .  Exercise Daily Walk A daily 20 minute walk should be part of your routine. Disease related apathy can be a significant roadblock to exercise and the only way to overcome this is to make it a daily routine and perhaps have a reward at the end (something your loved one loves to eat or drink perhaps) or a personal trainer coming to the home can also be very useful. Most importantly, the patient is much more likely to exercise if the caregiver / spouse does it with him/her. In general a structured, repetitive schedule is best.  General Health: Any diseases which effect your body will effect your brain such as a pneumonia, urinary infection, blood clot, heart attack or stroke. Keep contact with your primary care doctor for regular follow ups.  Sleep. A good nights sleep is healthy for the brain. Seven hours is recommended. If you have insomnia or poor sleep habits we can give you some instructions. If you have sleep apnea wear your mask.  Diet: Eating a heart healthy diet is also a good idea; fish and poultry instead of red meat, nuts (mostly non-peanuts), vegetables, fruits, olive oil or canola oil (instead of butter), minimal salt (use other spices to flavor foods), whole grain rice, bread, cereal and pasta and wine in moderation.Research is now showing that the MIND diet, which is a combination of The Mediterranean diet and the DASH diet, is beneficial for cognitive processing and longevity. Information about this diet can be found in The MIND Diet, a book by Alonna Minium, MS, RDN, and online at [employer]  Finances, Power of [address] and Advance Directives: You should consider putting legal safeguards in place with regard to financial and medical decision making. While the spouse always has power of attorney for medical and financial issues in the absence of any form, you should consider what you want in case the spouse / caregiver is no longer around or capable of making decisions.   Orders Placed This Encounter  Procedures  Ambulatory referral to Psychiatry    No orders of the defined types were placed in this encounter.   Return if symptoms worsen or fail to improve.    Windell Norfolk, MD 06/11/2023, 12:45 PM  [facility] [address] [place], [place] [address] [phone]

## 2023-06-11 NOTE — Telephone Encounter (Signed)
Referral sent to [facility] (P) [phone] (F) [phone]

## 2023-07-05 ENCOUNTER — Encounter: Payer: Self-pay | Admitting: Neurology

## 2023-07-28 NOTE — Telephone Encounter (Signed)
 Received fax back from [facility]. Pt is sched for 10/01/23 with Weyman Rodney

## 2023-08-19 ENCOUNTER — Other Ambulatory Visit: Payer: Self-pay | Admitting: Medical Genetics

## 2023-08-24 ENCOUNTER — Other Ambulatory Visit

## 2023-08-24 DIAGNOSIS — Z006 Encounter for examination for normal comparison and control in clinical research program: Secondary | ICD-10-CM

## 2023-09-01 DIAGNOSIS — J4520 Mild intermittent asthma, uncomplicated: Secondary | ICD-10-CM | POA: Diagnosis not present

## 2023-09-01 DIAGNOSIS — J0100 Acute maxillary sinusitis, unspecified: Secondary | ICD-10-CM | POA: Diagnosis not present

## 2023-09-04 DIAGNOSIS — Z860100 Personal history of colon polyps, unspecified: Secondary | ICD-10-CM | POA: Diagnosis not present

## 2023-09-04 DIAGNOSIS — D125 Benign neoplasm of sigmoid colon: Secondary | ICD-10-CM | POA: Diagnosis not present

## 2023-09-04 DIAGNOSIS — D124 Benign neoplasm of descending colon: Secondary | ICD-10-CM | POA: Diagnosis not present

## 2023-09-04 DIAGNOSIS — Z09 Encounter for follow-up examination after completed treatment for conditions other than malignant neoplasm: Secondary | ICD-10-CM | POA: Diagnosis not present

## 2023-09-04 DIAGNOSIS — K635 Polyp of colon: Secondary | ICD-10-CM | POA: Diagnosis not present

## 2023-09-17 LAB — GENECONNECT MOLECULAR SCREEN

## 2023-09-18 ENCOUNTER — Other Ambulatory Visit: Payer: Self-pay | Admitting: Medical Genetics

## 2023-09-18 ENCOUNTER — Telehealth: Payer: Self-pay | Admitting: Medical Genetics

## 2023-09-18 DIAGNOSIS — Z006 Encounter for examination for normal comparison and control in clinical research program: Secondary | ICD-10-CM

## 2023-09-18 NOTE — Progress Notes (Signed)
 Initial result was a TNP. New order requested. Confirmed consent on file.

## 2023-09-18 NOTE — Telephone Encounter (Signed)
 [facility]  @today @ 9:46 AM  Confirmed I was speaking with Brent Morrow [phone] by using name and DOB. Informed participant the reason for this call is to follow-up on a recent sample the participant provided at one of the [facility] lab locations. Informed participant the test was not able to be completed with this sample and apologized for the inconvenience. Participant was requested to provide a new sample at one of our participating labs at no cost so that participant can continue participation and receive test results. Participant agreed to provide another sample. Participant was provided the [employer] program website to learn why this may have happened. Participant was thanked for their time and continued support of the above study.

## 2023-09-24 ENCOUNTER — Other Ambulatory Visit (HOSPITAL_COMMUNITY)
Admission: RE | Admit: 2023-09-24 | Discharge: 2023-09-24 | Disposition: A | Discharge status: Home / Self Care | Payer: Self-pay | Source: Ambulatory Visit | Attending: Medical Genetics | Admitting: Medical Genetics

## 2023-09-24 DIAGNOSIS — Z006 Encounter for examination for normal comparison and control in clinical research program: Secondary | ICD-10-CM | POA: Insufficient documentation

## 2023-09-24 DIAGNOSIS — R195 Other fecal abnormalities: Secondary | ICD-10-CM | POA: Diagnosis not present

## 2023-09-29 DIAGNOSIS — R197 Diarrhea, unspecified: Secondary | ICD-10-CM | POA: Diagnosis not present

## 2023-09-29 DIAGNOSIS — R195 Other fecal abnormalities: Secondary | ICD-10-CM | POA: Diagnosis not present

## 2023-09-29 DIAGNOSIS — R1030 Lower abdominal pain, unspecified: Secondary | ICD-10-CM | POA: Diagnosis not present

## 2023-10-01 DIAGNOSIS — Z79899 Other long term (current) drug therapy: Secondary | ICD-10-CM | POA: Diagnosis not present

## 2023-10-01 DIAGNOSIS — F902 Attention-deficit hyperactivity disorder, combined type: Secondary | ICD-10-CM | POA: Diagnosis not present

## 2023-10-06 LAB — GENECONNECT MOLECULAR SCREEN: Genetic Analysis Overall Interpretation: NEGATIVE

## 2023-10-09 ENCOUNTER — Ambulatory Visit: Admitting: Orthopedic Surgery

## 2023-10-09 ENCOUNTER — Other Ambulatory Visit (INDEPENDENT_AMBULATORY_CARE_PROVIDER_SITE_OTHER): Payer: Self-pay

## 2023-10-09 ENCOUNTER — Encounter: Payer: Self-pay | Admitting: Orthopedic Surgery

## 2023-10-09 DIAGNOSIS — M65962 Unspecified synovitis and tenosynovitis, left lower leg: Secondary | ICD-10-CM | POA: Diagnosis not present

## 2023-10-09 DIAGNOSIS — M25562 Pain in left knee: Secondary | ICD-10-CM | POA: Diagnosis not present

## 2023-10-09 MED ORDER — MELOXICAM 15 MG PO TABS
ORAL_TABLET | ORAL | 0 refills | Status: AC
Start: 2023-10-09 — End: ?

## 2023-10-09 NOTE — Progress Notes (Unsigned)
 Office Visit Note   Patient: Brent Morrow           Date of Birth: 1971/01/25           MRN: [id number] Visit Date: 10/09/2023 Requested by: Ransom Byers, MD [address] [place],  [place] [address] PCP: Ransom Byers, MD  Subjective: Chief Complaint  Patient presents with   Left Knee - Pain    HPI: Brent Morrow is a 53 y.o. male who presents to the office reporting left knee pain.  Patient states that he was doing pool workouts about 2 weeks ago and he had some knee pain after that workout.  Developed some swelling as well.  Does report some new mechanical symptoms in the knee.  Denies any frank instability.  He is not taking too much for pain in terms of medications.  He states that he has a new cyst behind his knee and that is where his pain is the worst.  Doing the breaststroke hurts his knee as well..                ROS: All systems reviewed are negative as they relate to the chief complaint within the history of present illness.  Patient denies fevers or chills.  Assessment & Plan: Visit Diagnoses:  1. Left knee pain, unspecified chronicity     Plan: Impression is 2-week history of left knee pain with effusion present in the left knee along with a Baker's cyst.  Plan is Mobic 15 mg a day for [redacted] week along with aspiration and injection of the knee today.  3-week return with decision for or against MRI scanning at that time depending on his response to the injection.  He is a very physically fit person and has excellent mobility and strength in the knee.  Nonload bearing quad strengthening exercises and physical therapy type training exercises are discussed for him to implement.  We will see him back in 3 weeks for clinical recheck.  Follow-Up Instructions: Return in about 3 weeks (around 10/30/2023).   Orders:  Orders Placed This Encounter  Procedures   XR KNEE 3 VIEW LEFT   Meds ordered this encounter  Medications   meloxicam (MOBIC) 15 MG tablet     Sig: 1 po q d x 7 days    Dispense:  30 tablet    Refill:  0      Procedures: Large Joint Inj: L knee on 10/09/2023 6:03 PM Indications: diagnostic evaluation, joint swelling and pain Details: 18 G 1.5 in needle, superolateral approach  Arthrogram: No  Medications: 5 mL lidocaine 1 %; 4 mL bupivacaine 0.25 %; 40 mg triamcinolone acetonide 40 MG/ML Outcome: tolerated well, no immediate complications Procedure, treatment alternatives, risks and benefits explained, specific risks discussed. Consent was given by the patient. Immediately prior to procedure a time out was called to verify the correct patient, procedure, equipment, support staff and site/side marked as required. Patient was prepped and draped in the usual sterile fashion.       Clinical Data: No additional findings.  Objective: Vital Signs: There were no vitals taken for this visit.  Physical Exam:  Constitutional: Patient appears well-developed HEENT:  Head: Normocephalic Eyes:EOM are normal Neck: Normal range of motion Cardiovascular: Normal rate Pulmonary/chest: Effort normal Neurologic: Patient is alert Skin: Skin is warm Psychiatric: Patient has normal mood and affect  Ortho Exam: Ortho exam demonstrates normal gait alignment.  Full range of motion of both knees.  Mild effusion left  knee with stable collateral and cruciate ligaments.  Has a little bit more patellofemoral crepitus on the left compared to the right.  Has medial and lateral joint line tenderness.  Baker's cyst is palpable in the back of the knee which is also visualized on ultrasound.  No groin pain with internal or external rotation of either leg.  Does have trace effusion in the right knee as well.  Specialty Comments:  No specialty comments available.  Imaging: XR KNEE 3 VIEW LEFT Result Date: 10/09/2023 AP lateral and merchant radiographs of the left knee are reviewed.  No acute fracture.  No dislocation.  Alignment normal.  No significant  degenerative changes in the medial lateral or patellofemoral compartments     PMFS History: Patient Active Problem List   Diagnosis Date Noted   Chronic pansinusitis 04/24/2023   Hypertrophy of nasal turbinates 04/24/2023   History of viral illness 06/11/2020   Palpitations 06/11/2020   Past Medical History:  Diagnosis Date   Asthma    Palpitations    Tachycardia    Viral illness     Family History  Problem Relation Age of Onset   Dementia Father     History reviewed. No pertinent surgical history. Social History   Occupational History   Not on file  Tobacco Use   Smoking status: Never   Smokeless tobacco: Never  Substance and Sexual Activity   Alcohol use: Yes    Comment: occassional   Drug use: Never   Sexual activity: Yes

## 2023-10-10 MED ORDER — LIDOCAINE HCL 1 % IJ SOLN: 5.0000 mL | INTRAMUSCULAR | Status: AC | PRN

## 2023-10-10 MED ORDER — BUPIVACAINE HCL 0.25 % IJ SOLN: 4.0000 mL | INTRAMUSCULAR | Status: AC | PRN

## 2023-10-10 MED ORDER — TRIAMCINOLONE ACETONIDE 40 MG/ML IJ SUSP
40.0000 mg | INTRAMUSCULAR | Status: AC | PRN
  Administered 2023-10-09: 40 mg via INTRA_ARTICULAR

## 2024-02-17 DIAGNOSIS — Z125 Encounter for screening for malignant neoplasm of prostate: Secondary | ICD-10-CM | POA: Diagnosis not present

## 2024-02-17 DIAGNOSIS — E7800 Pure hypercholesterolemia, unspecified: Secondary | ICD-10-CM | POA: Diagnosis not present

## 2024-02-17 DIAGNOSIS — I10 Essential (primary) hypertension: Secondary | ICD-10-CM | POA: Diagnosis not present

## 2024-02-29 ENCOUNTER — Encounter: Payer: Self-pay | Admitting: Radiology

## 2024-03-09 DIAGNOSIS — M545 Low back pain, unspecified: Secondary | ICD-10-CM | POA: Diagnosis not present
